# Patient Record
Sex: Male | Born: 1960 | Race: Black or African American | Hispanic: No | Marital: Single | Smoking: Current every day smoker
Health system: Southern US, Community
[De-identification: ages and names within clinical notes are randomized; demographics above are authoritative.]

## PROBLEM LIST (undated history)

## (undated) DIAGNOSIS — M1990 Unspecified osteoarthritis, unspecified site: Secondary | ICD-10-CM

## (undated) HISTORY — PX: HERNIA REPAIR: SHX51

## (undated) HISTORY — PX: SHOULDER SURGERY: SHX246

## (undated) HISTORY — PX: KNEE SURGERY: SHX244

---

## 2014-02-13 ENCOUNTER — Emergency Department (HOSPITAL_COMMUNITY): Payer: Self-pay

## 2014-02-13 ENCOUNTER — Emergency Department (HOSPITAL_COMMUNITY)
Admission: EM | Admit: 2014-02-13 | Discharge: 2014-02-13 | Disposition: A | Discharge status: Home / Self Care | Payer: Self-pay | Attending: Emergency Medicine | Admitting: Emergency Medicine

## 2014-02-13 ENCOUNTER — Encounter (HOSPITAL_COMMUNITY): Payer: Self-pay | Admitting: Emergency Medicine

## 2014-02-13 DIAGNOSIS — W010XXA Fall on same level from slipping, tripping and stumbling without subsequent striking against object, initial encounter: Secondary | ICD-10-CM | POA: Insufficient documentation

## 2014-02-13 DIAGNOSIS — S79929A Unspecified injury of unspecified thigh, initial encounter: Principal | ICD-10-CM

## 2014-02-13 DIAGNOSIS — Y9389 Activity, other specified: Secondary | ICD-10-CM | POA: Insufficient documentation

## 2014-02-13 DIAGNOSIS — S79919A Unspecified injury of unspecified hip, initial encounter: Secondary | ICD-10-CM | POA: Insufficient documentation

## 2014-02-13 DIAGNOSIS — M549 Dorsalgia, unspecified: Secondary | ICD-10-CM

## 2014-02-13 DIAGNOSIS — F17200 Nicotine dependence, unspecified, uncomplicated: Secondary | ICD-10-CM | POA: Insufficient documentation

## 2014-02-13 DIAGNOSIS — M25559 Pain in unspecified hip: Secondary | ICD-10-CM

## 2014-02-13 DIAGNOSIS — W108XXA Fall (on) (from) other stairs and steps, initial encounter: Secondary | ICD-10-CM | POA: Insufficient documentation

## 2014-02-13 DIAGNOSIS — Y929 Unspecified place or not applicable: Secondary | ICD-10-CM | POA: Insufficient documentation

## 2014-02-13 DIAGNOSIS — M129 Arthropathy, unspecified: Secondary | ICD-10-CM | POA: Insufficient documentation

## 2014-02-13 DIAGNOSIS — IMO0002 Reserved for concepts with insufficient information to code with codable children: Secondary | ICD-10-CM | POA: Insufficient documentation

## 2014-02-13 HISTORY — DX: Unspecified osteoarthritis, unspecified site: M19.90

## 2014-02-13 MED ORDER — HYDROCODONE-ACETAMINOPHEN 5-325 MG PO TABS: 1.0000 | ORAL_TABLET | Freq: Four times a day (QID) | ORAL | Status: AC | PRN

## 2014-02-13 MED ORDER — HYDROMORPHONE HCL PF 1 MG/ML IJ SOLN
1.0000 mg | Freq: Once | INTRAMUSCULAR | Status: AC
  Administered 2014-02-13: 1 mg via INTRAMUSCULAR
  Filled 2014-02-13: qty 1

## 2014-02-13 MED ORDER — KETOROLAC TROMETHAMINE 60 MG/2ML IM SOLN
60.0000 mg | Freq: Once | INTRAMUSCULAR | Status: AC
  Administered 2014-02-13: 60 mg via INTRAMUSCULAR
  Filled 2014-02-13: qty 2

## 2014-02-13 NOTE — ED Notes (Signed)
The patient said he fell about two days ago down some wooden steps that were wet.  He said he landed on his left hip and got up and had no pain.  The next day he started feeling the pain and it got worse so he came to be evaluated.  Patient rates his pain 10/10.

## 2014-02-13 NOTE — ED Provider Notes (Signed)
CSN: [id number]     Arrival date & time 02/13/14  1813 History   First MD Initiated Contact with Patient 02/13/14 2050     No chief complaint on file.    (Consider location/radiation/quality/duration/timing/severity/associated sxs/prior Treatment) HPI  This is a 53 y.o. male with PMH right shoulder tear, presenting today with back, hip pain.  Onset 2 days ago.  Located lumbar back, left hip.  Persistent.  Sharp.  Alleviated but not resolved with ibuprofen.  Non-radiating.  Negative for weakness, numbness, tingling, change in bowel or bladder habits.  Pt suffered mechanical fall 3 days ago, slipped on steps, landed on left hip.  Did not hit head, neck, or back.  Negative for LOC, amnesia, or blood loss.  Pt has been ambulatory since then without complication.    Past Medical History  Diagnosis Date  . Arthritis    Past Surgical History  Procedure Laterality Date  . Shoulder surgery    . Knee surgery    . Hernia repair     History reviewed. No pertinent family history. History  Substance Use Topics  . Smoking status: Current Every Day Smoker -- 1.00 packs/day    Types: Cigarettes  . Smokeless tobacco: Current User  . Alcohol Use: Yes     Comment: 6pack a month    Review of Systems  Constitutional: Negative for fever and chills.  HENT: Negative for facial swelling.   Eyes: Negative for pain and visual disturbance.  Respiratory: Negative for chest tightness and shortness of breath.   Cardiovascular: Negative for chest pain.  Gastrointestinal: Negative for nausea and vomiting.  Genitourinary: Negative for dysuria.  Musculoskeletal: Positive for arthralgias and back pain.  Neurological: Negative for headaches.  Psychiatric/Behavioral: Negative for behavioral problems.      Allergies  Review of patient's allergies indicates not on file.  Home Medications   Prior to Admission medications   Medication Sig Start Date End Date Taking? Authorizing Provider  ibuprofen  (ADVIL,MOTRIN) 200 MG tablet Take 200 mg by mouth every 6 (six) hours as needed for moderate pain.    Yes Historical Provider, MD   BP 120/57  Pulse 81  Temp(Src) 98.6 F (37 C) (Oral)  Resp 20  Ht 5\' 9"  (1.753 m)  Wt 198 lb (89.812 kg)  BMI 29.23 kg/m2  SpO2 100% Physical Exam  Constitutional: He is oriented to person, place, and time. He appears well-developed and well-nourished. No distress.  HENT:  Head: Normocephalic and atraumatic.  Mouth/Throat: No oropharyngeal exudate.  Eyes: Conjunctivae are normal. Pupils are equal, round, and reactive to light. No scleral icterus.  Neck: Normal range of motion. No tracheal deviation present. No thyromegaly present.  Cardiovascular: Normal rate, regular rhythm and normal heart sounds.  Exam reveals no gallop and no friction rub.   No murmur heard. Pulmonary/Chest: Effort normal and breath sounds normal. No stridor. No respiratory distress. He has no wheezes. He has no rales. He exhibits no tenderness.  Abdominal: Soft. He exhibits no distension and no mass. There is no tenderness. There is no rebound and no guarding.  Musculoskeletal: Normal range of motion. He exhibits tenderness (lumbar area, left buttocks). He exhibits no edema.  Neurological: He is alert and oriented to person, place, and time.  Skin: Skin is warm and dry. He is not diaphoretic.    ED Course  Procedures (including critical care time) Labs Review Labs Reviewed - No data to display  Imaging Review No results found.   EKG Interpretation None  MDM   Final diagnoses:  None    This is a 53 y.o. male with PMH right shoulder tear, presenting today with back, hip pain.  Onset 2 days ago.  Located lumbar back, left hip.  Persistent.  Sharp.  Alleviated but not resolved with ibuprofen.  Non-radiating.  Negative for weakness, numbness, tingling, change in bowel or bladder habits.  Pt suffered mechanical fall 3 days ago, slipped on steps, landed on left hip.  Did  not hit head, neck, or back.  Negative for LOC, amnesia, or blood loss.  Pt has been ambulatory since then without complication.    Exam as above with TTP in lumbar area, left buttocks.  Neuro exam WNL.  I do not believe his back pain represents AAA, cauda equina, epidural abscess, cord involvement, fx, dislocation or any other concerning etiology.  I also do not believe this represents radiculopathy.  I have alleviated his pain with IM dilaudid, toradol.  XR lumbar spine, left hip without acute findings.  Pt stable for discharge, FU.  All questions answered.  Return precautions given.  I have discussed case and care has been guided by my attending physician, Dr. Anitra LauthPlunkett.    Loma BostonStirling Maveryk Renstrom, MD 02/14/14 [id number]

## 2014-02-13 NOTE — Discharge Instructions (Signed)
Arthralgia  Your caregiver has diagnosed you as suffering from an arthralgia. Arthralgia means there is pain in a joint. This can come from many reasons including:  · Bruising the joint which causes soreness (inflammation) in the joint.  · Wear and tear on the joints which occur as we grow older (osteoarthritis).  · Overusing the joint.  · Various forms of arthritis.  · Infections of the joint.  Regardless of the cause of pain in your joint, most of these different pains respond to anti-inflammatory drugs and rest. The exception to this is when a joint is infected, and these cases are treated with antibiotics, if it is a bacterial infection.  HOME CARE INSTRUCTIONS   · Rest the injured area for as long as directed by your caregiver. Then slowly start using the joint as directed by your caregiver and as the pain allows. Crutches as directed may be useful if the ankles, knees or hips are involved. If the knee was splinted or casted, continue use and care as directed. If an stretchy or elastic wrapping bandage has been applied today, it should be removed and re-applied every 3 to 4 hours. It should not be applied tightly, but firmly enough to keep swelling down. Watch toes and feet for swelling, bluish discoloration, coldness, numbness or excessive pain. If any of these problems (symptoms) occur, remove the ace bandage and re-apply more loosely. If these symptoms persist, contact your caregiver or return to this location.  · For the first 24 hours, keep the injured extremity elevated on pillows while lying down.  · Apply ice for 15-20 minutes to the sore joint every couple hours while awake for the first half day. Then 03-04 times per day for the first 48 hours. Put the ice in a plastic bag and place a towel between the bag of ice and your skin.  · Wear any splinting, casting, elastic bandage applications, or slings as instructed.  · Only take over-the-counter or prescription medicines for pain, discomfort, or fever as  directed by your caregiver. Do not use aspirin immediately after the injury unless instructed by your physician. Aspirin can cause increased bleeding and bruising of the tissues.  · If you were given crutches, continue to use them as instructed and do not resume weight bearing on the sore joint until instructed.  Persistent pain and inability to use the sore joint as directed for more than 2 to 3 days are warning signs indicating that you should see a caregiver for a follow-up visit as soon as possible. Initially, a hairline fracture (break in bone) may not be evident on X-rays. Persistent pain and swelling indicate that further evaluation, non-weight bearing or use of the joint (use of crutches or slings as instructed), or further X-rays are indicated. X-rays may sometimes not show a small fracture until a week or 10 days later. Make a follow-up appointment with your own caregiver or one to whom we have referred you. A radiologist (specialist in reading X-rays) may read your X-rays. Make sure you know how you are to obtain your X-ray results. Do not assume everything is normal if you do not hear from us.  SEEK MEDICAL CARE IF:  Bruising, swelling, or pain increases.  SEEK IMMEDIATE MEDICAL CARE IF:   · Your fingers or toes are numb or blue.  · The pain is not responding to medications and continues to stay the same or get worse.  · The pain in your joint becomes severe.  · You   develop a fever over 102° F (38.9° C).  · It becomes impossible to move or use the joint.  MAKE SURE YOU:   · Understand these instructions.  · Will watch your condition.  · Will get help right away if you are not doing well or get worse.  Document Released: 10/19/2005 Document Revised: 01/11/2012 Document Reviewed: 06/06/2008  [employer]® Patient Information ©2014 [employer].

## 2014-02-14 NOTE — ED Provider Notes (Signed)
I saw and evaluated the patient, reviewed the resident's note and I agree with the findings and plan.   EKG Interpretation None      Pt with mechanical fall and msk back and hip pain.  Point tenderness over the hip and l-spine but no central pain or neuro sx.  Imaging neg and pt felt better after meds.  Gwyneth SproutWhitney Shaneequa Bahner, MD 02/14/14 1004

## 2015-04-29 IMAGING — CR DG LUMBAR SPINE COMPLETE 4+V
5 series · 5 of 5 positions shown · non-contrast
Comparison: None.

CLINICAL DATA: Fall down steps with lower back pain.

EXAM:
LUMBAR SPINE - COMPLETE 4+ VIEW

[t l-spine a.p.]
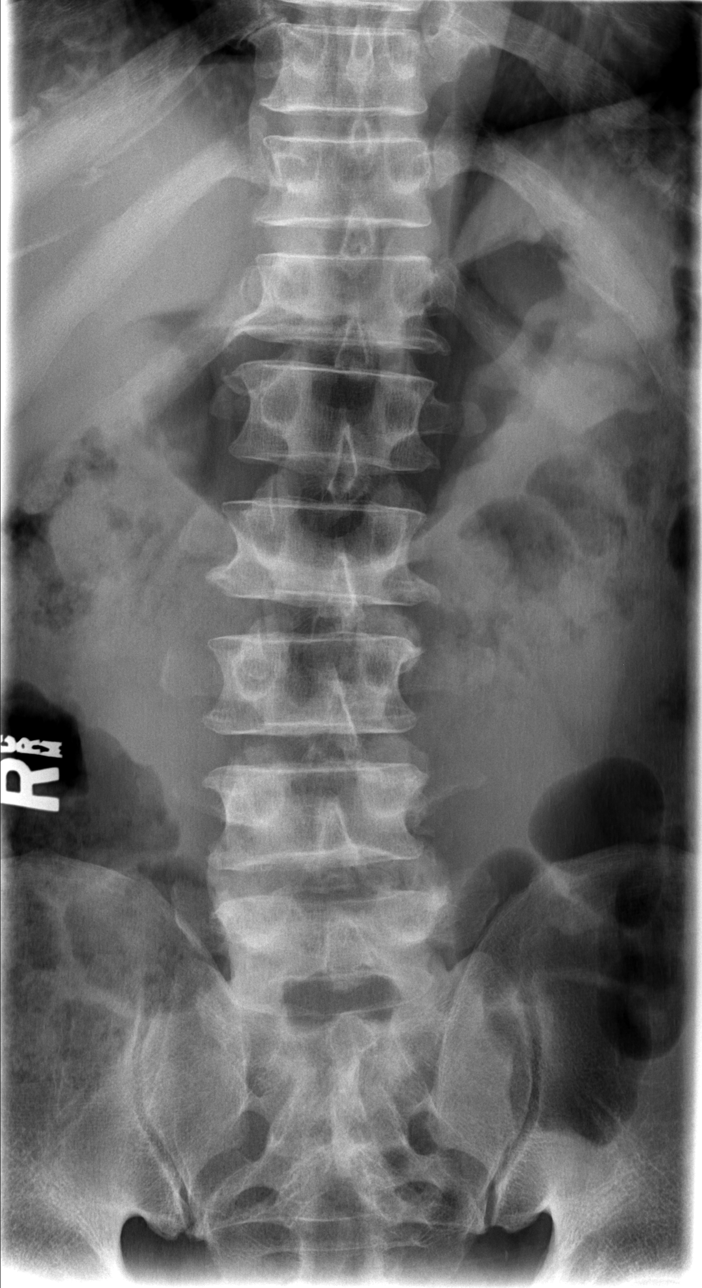

[t l-spine oblique exposure (1 of 2)]
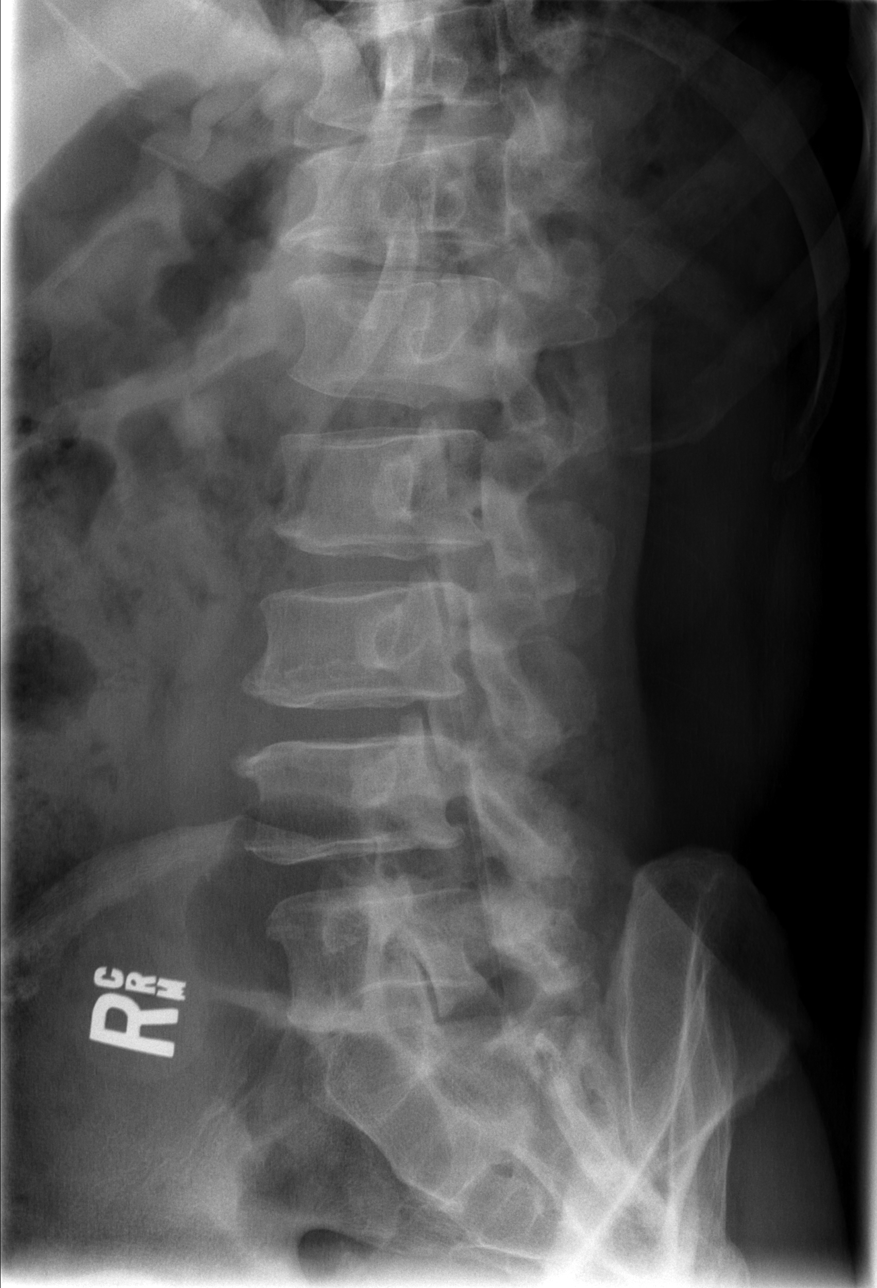

[t l-spine oblique exposure (2 of 2)]
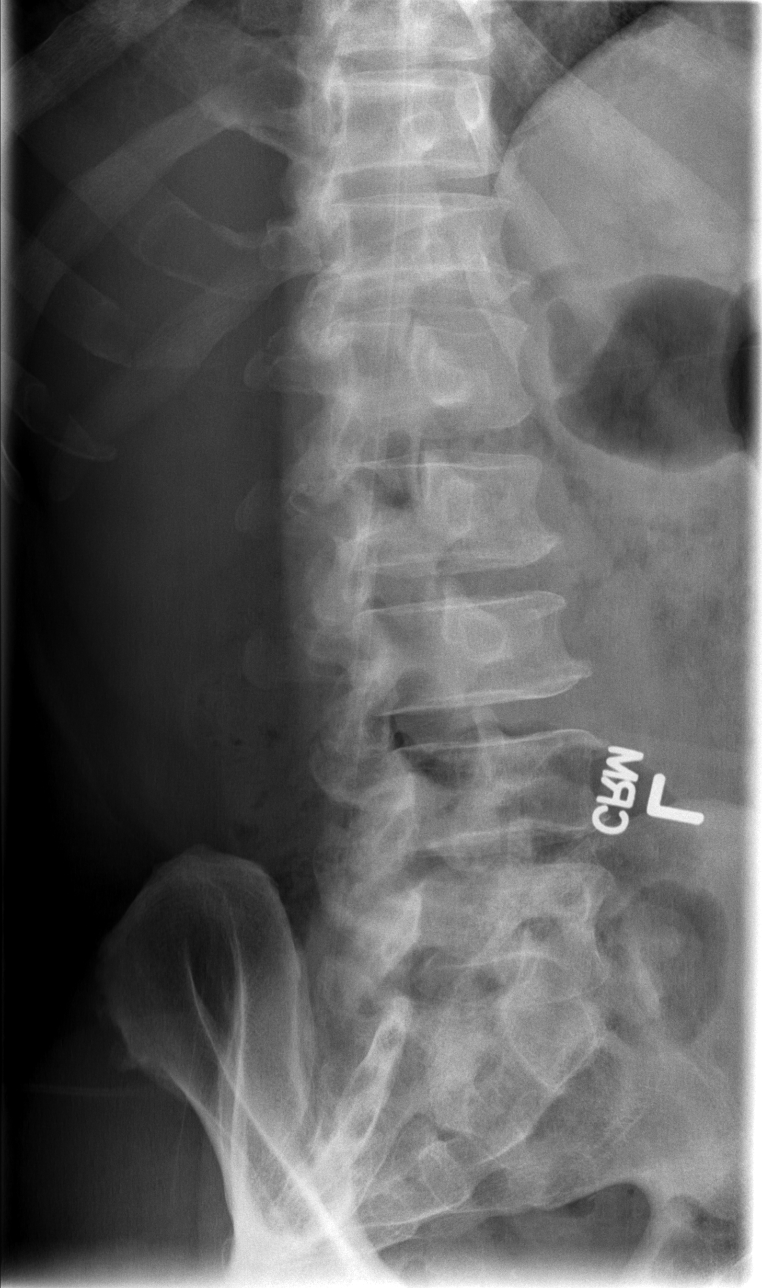

[t l-spine lat]
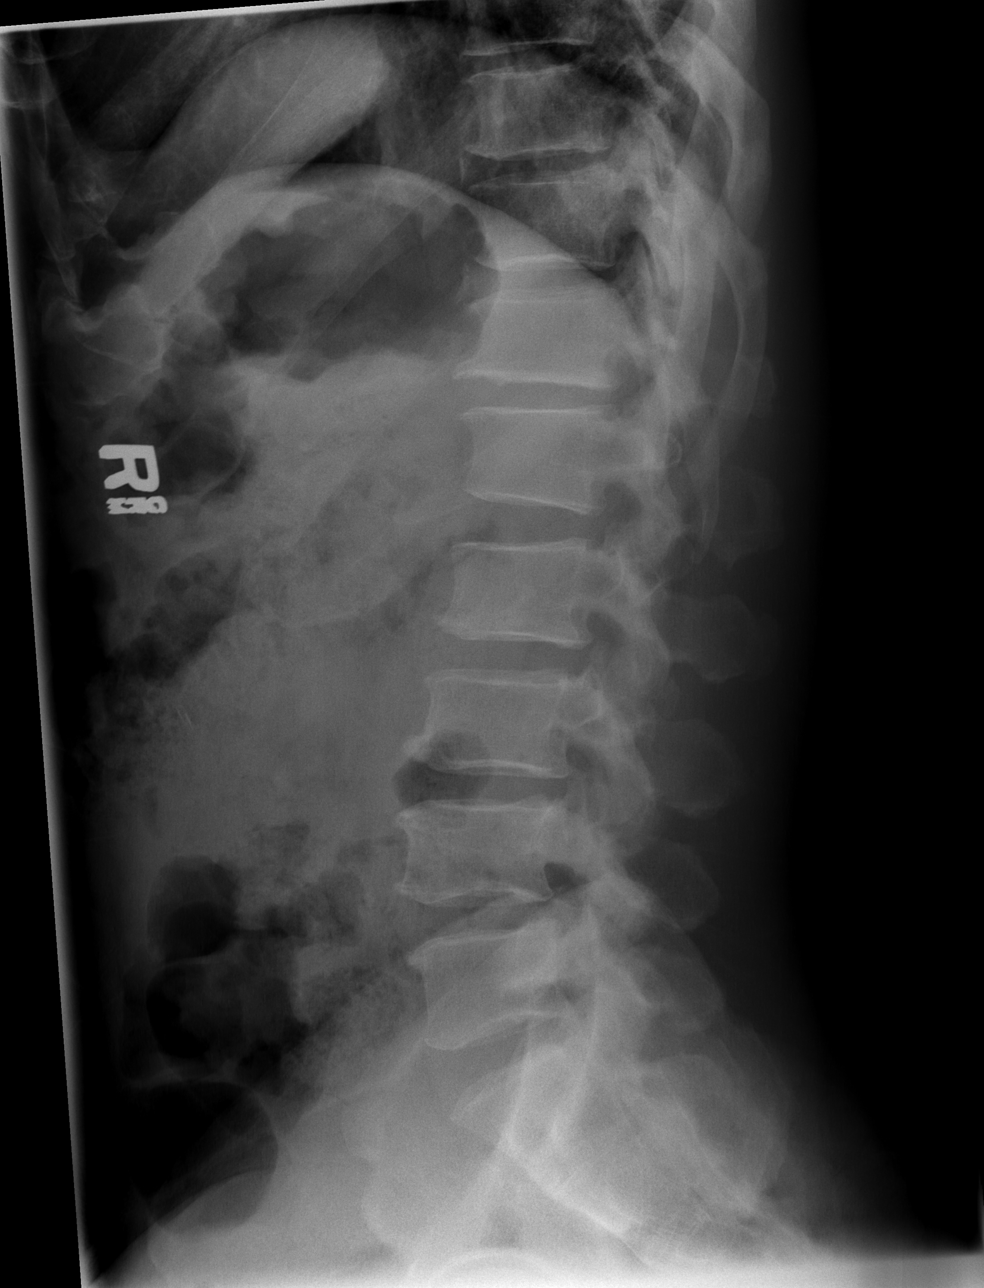

[t l-spine l5-s1 spot]
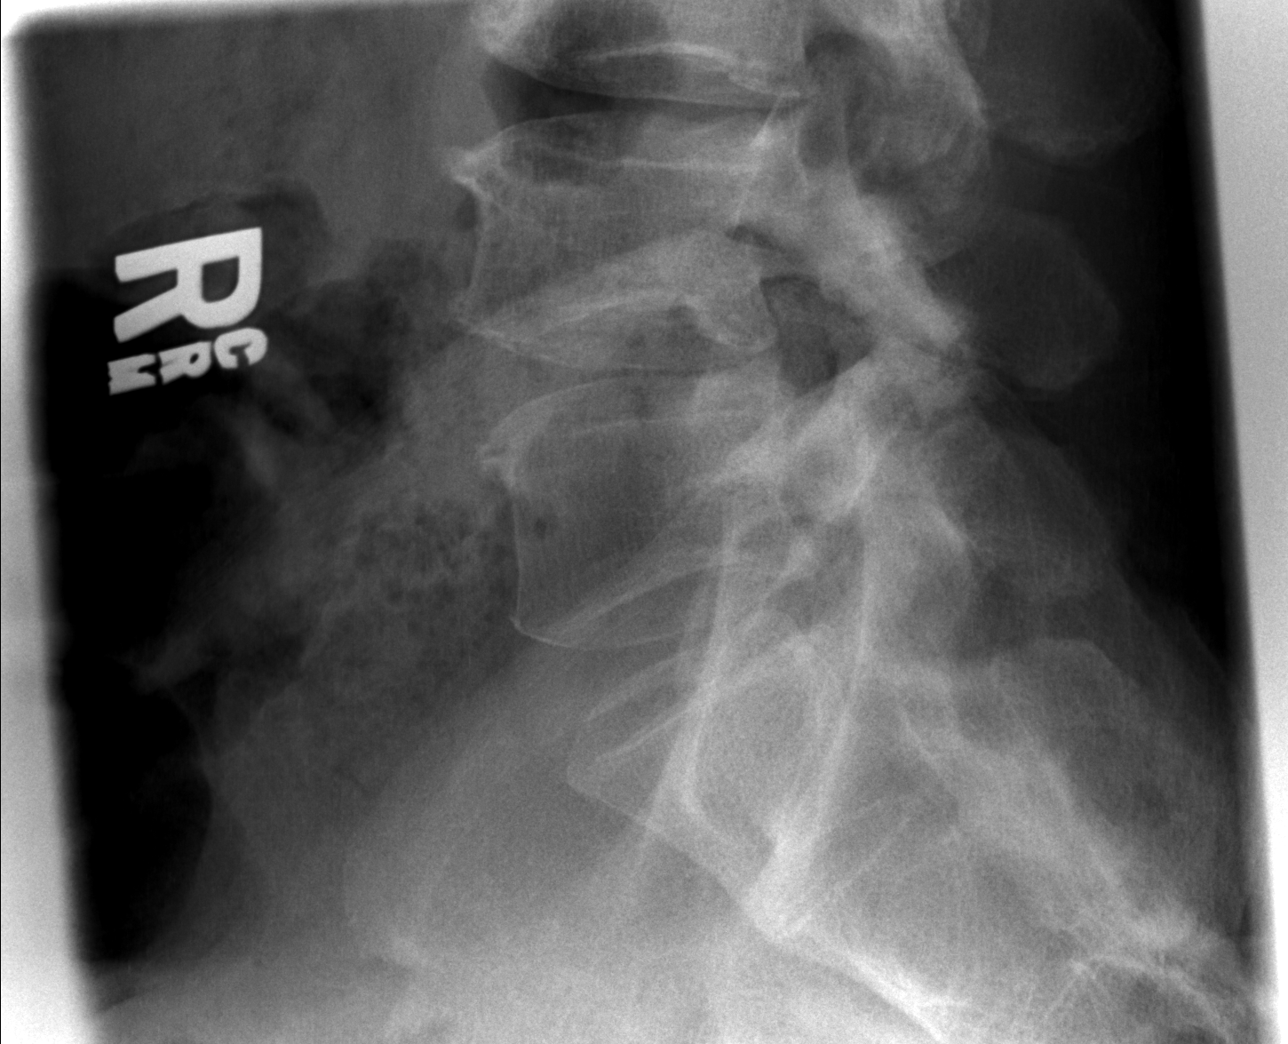

[5 of 5 positions shown; findings below may reference images not displayed]

FINDINGS: No acute fracture or subluxation is identified. Mild proliferative
changes are seen at T12-L1, L3-4 and L4-5. No bony lesions are
identified.
IMPRESSION: No acute fracture.

## 2015-04-29 IMAGING — CR DG HIP (WITH OR WITHOUT PELVIS) 2-3V*L*
3 series · 3 of 3 positions shown · non-contrast
Comparison: None.

CLINICAL DATA: Fall down steps with left hip pain.

EXAM:
LEFT HIP - COMPLETE 2+ VIEW

[t pelvis a.p.]
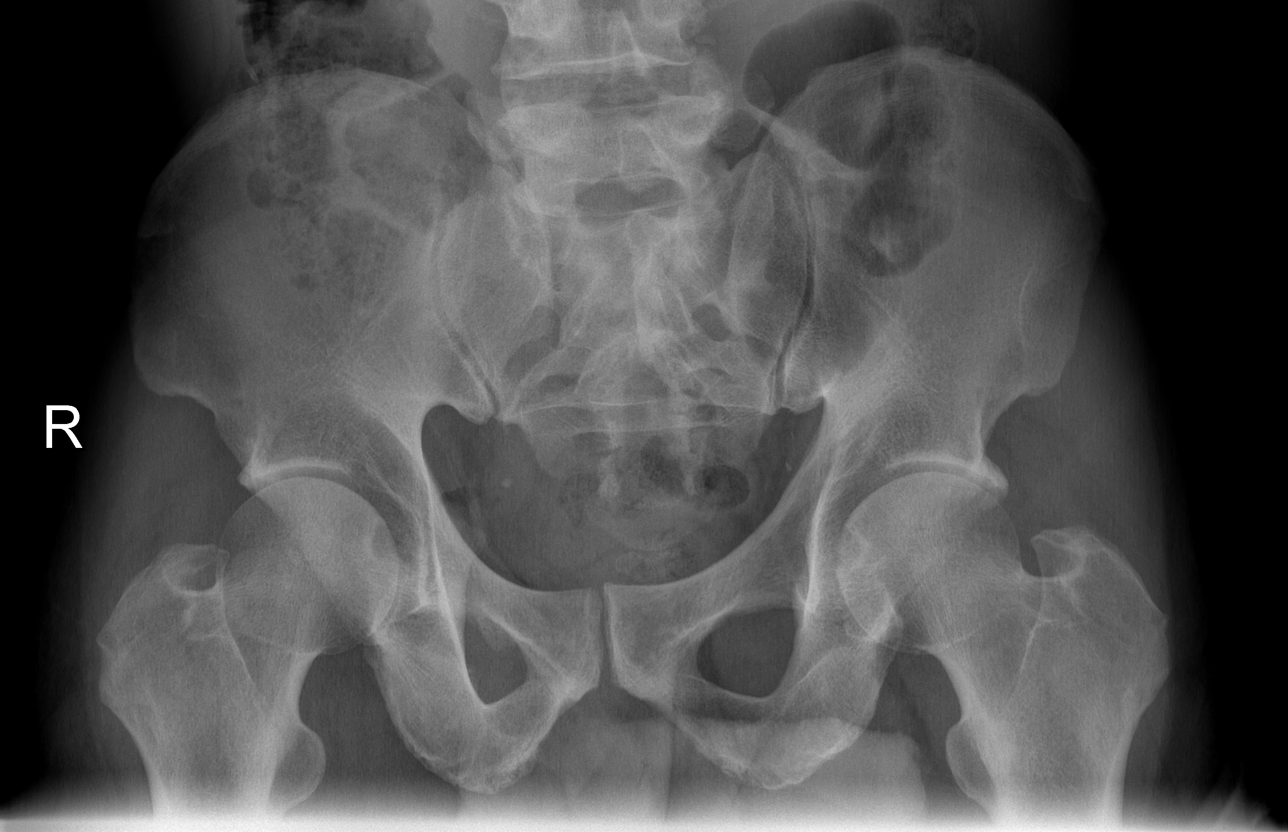

[t hip ap left]
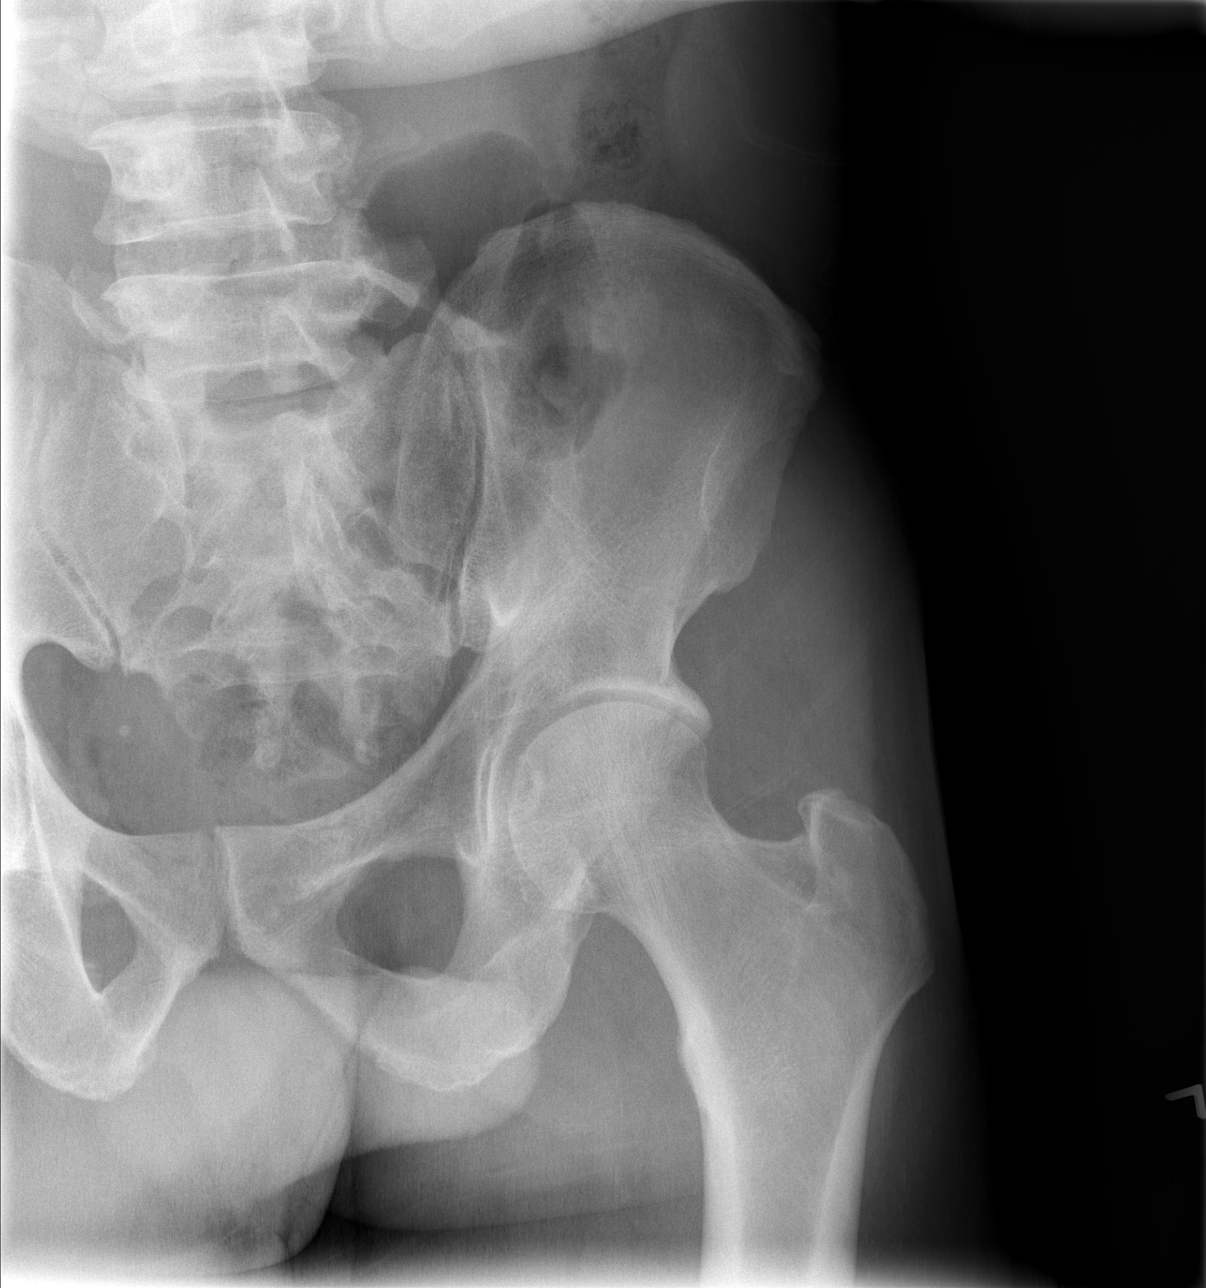

[t hip frog leg left]
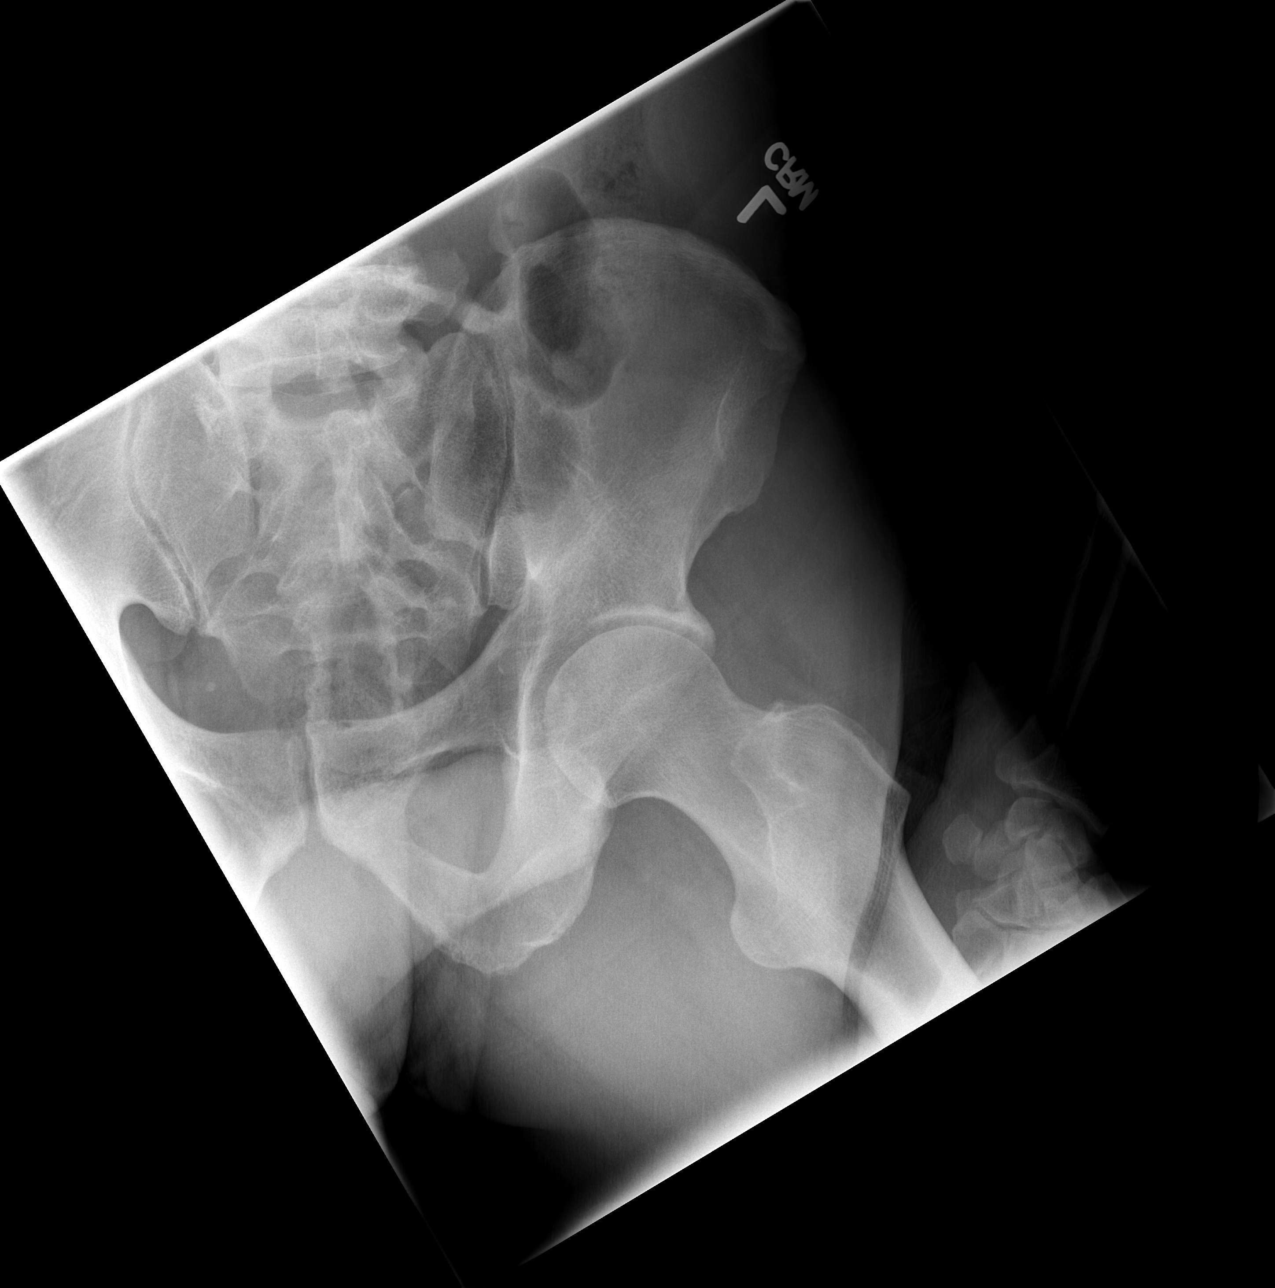

[3 of 3 positions shown; findings below may reference images not displayed]

FINDINGS: There is no evidence of hip fracture or dislocation. There is no
evidence of arthropathy. The bony pelvis is intact and within normal
limits. No bony lesions are seen.
IMPRESSION: No evidence of hip fracture.
